# Patient Record
Sex: Female | Born: 1950 | Race: White | Hispanic: No | Marital: Married | Smoking: Never smoker
Health system: Southern US, Community
[De-identification: ages and names within clinical notes are randomized; demographics above are authoritative.]

## PROBLEM LIST (undated history)

## (undated) DIAGNOSIS — G4700 Insomnia, unspecified: Secondary | ICD-10-CM

## (undated) DIAGNOSIS — F329 Major depressive disorder, single episode, unspecified: Secondary | ICD-10-CM

## (undated) DIAGNOSIS — E785 Hyperlipidemia, unspecified: Secondary | ICD-10-CM

## (undated) DIAGNOSIS — I10 Essential (primary) hypertension: Secondary | ICD-10-CM

## (undated) DIAGNOSIS — F32A Depression, unspecified: Secondary | ICD-10-CM

## (undated) DIAGNOSIS — F419 Anxiety disorder, unspecified: Secondary | ICD-10-CM

## (undated) DIAGNOSIS — K219 Gastro-esophageal reflux disease without esophagitis: Secondary | ICD-10-CM

## (undated) HISTORY — DX: Gastro-esophageal reflux disease without esophagitis: K21.9

## (undated) HISTORY — DX: Insomnia, unspecified: G47.00

## (undated) HISTORY — DX: Depression, unspecified: F32.A

## (undated) HISTORY — PX: REPLACEMENT TOTAL KNEE BILATERAL: SUR1225

## (undated) HISTORY — PX: TONSILLECTOMY: SUR1361

## (undated) HISTORY — PX: OTHER SURGICAL HISTORY: SHX169

## (undated) HISTORY — DX: Anxiety disorder, unspecified: F41.9

## (undated) HISTORY — PX: ROTATOR CUFF REPAIR: SHX139

## (undated) HISTORY — DX: Hyperlipidemia, unspecified: E78.5

## (undated) HISTORY — DX: Essential (primary) hypertension: I10

---
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
## 2010-01-17 ENCOUNTER — Inpatient Hospital Stay (HOSPITAL_COMMUNITY)
Admission: RE | Admit: 2010-01-17 | Discharge: 2010-01-21 | Payer: Self-pay | Source: Home / Self Care | Attending: Orthopedic Surgery | Admitting: Orthopedic Surgery

## 2010-01-19 ENCOUNTER — Encounter (INDEPENDENT_AMBULATORY_CARE_PROVIDER_SITE_OTHER): Payer: Self-pay | Admitting: Orthopedic Surgery

## 2010-04-05 NOTE — Discharge Summary (Signed)
NAMESHARLET, Payne          ACCOUNT NO.:  [id number]  MEDICAL RECORD NO.:  [id number]          PATIENT TYPE:  INP  LOCATION:  5525                         FACILITY:  [facility]  PHYSICIAN:  Mila Homer. Paula Payne, M.D. DATE OF BIRTH:  04/07/1950  DATE OF ADMISSION:  01/17/2010 DATE OF DISCHARGE:  01/21/2010                              DISCHARGE SUMMARY   ADMISSION DIAGNOSIS:  Osteoarthritis of the right knee.  DISCHARGE DIAGNOSES: 1. Osteoarthritis of the right knee. 2. Status post right total knee arthroplasty. 3. Acute blood loss anemia, status post surgery. 4. Pulmonary embolism.  PROCEDURE:  Right total knee arthroplasty.  HISTORY OF PRESENT ILLNESS:  The patient is a 60 year old female who complained of pain in her right knee times several years, now interfering with activities of daily living.  Conservative treatments failed.  Risk and benefits of surgery were discussed with the patient. The patient would like to proceed with right total knee arthroplasty.  ALLERGIES:  Upon admission to the hospital, the patient was allergic SULFA, AMOXICILLIN, LORATADINE, and CLARITIN.  ADMISSION MEDICATIONS:  Upon admission to hospital, the patient was taking: 1. Vitamin D 1000 units daily. 2. Calcium 600 mg daily. 3. Aspirin 800 mg daily, stop 10 days post surgery. 4. Celebrex 200 mg daily. 5. Singulair 1 dose daily as needed. 6. Simvastatin 40 mg daily. 7. Fish oil 1000 mg daily. 8. Clonazepam 0.5 mg 1 tablet daily at bedtime. 9. Omeprazole 20 mg daily. 10.Nitro 50 mg daily. 11.Kenalog spray 50 as needed. 12.Aleve 2 daily.  HOSPITAL COURSE:  This is a 60 year old female who was admitted to hospital on January 17, 2010. After appropriate laboratory studies were obtained preoperatively as well as Ancef on-call to the operating room, she was taken to the OR where she underwent a right total knee arthroplasty.  She tolerated the procedure well and was taken to the PACU in good  condition.  The patient was placed on Dilaudid IV and other p.o. pain medication.  Foley was placed intraoperatively.  On postop day #1, vital signs stable.  The patient denied chest pain, shortness of breath, or calf pain.  The patient was started on Lovenox 30 mg subcu q.12 h. a day at 8 a.m.  The patient was started on Xarelto 10 mg p.o. daily started at 8 a.m.  Consults with PT, OT, and Care Management were made.  The patient is weightbearing as tolerated.  CPM 0- 9 degrees 6-8 hours per day.  Incentive spirometry was taught.  On postop day #2, the patient continued to progress with physical therapy, but slowly.  The patient's dressing was changed.  Marcaine pump and Hemovac were discontinued.  Foley catheter was discontinued.  The patient appeared to be short of breath and to be dizzy.  Angiogram was ordered which found a PE.  The patient was quickly called for consult. On postop day #4, the patient was stable.  Medicine was following for PE.  She was still doing physical therapy slowly for her knee and she was coming around.  On Friday, she was discharged after PT.  She was cleared by Medicine and she was set up with home health PT.  LABORATORY STUDIES:  Upon discharge from the hospital, H&H was 8.9 with 25.9, white blood cell count was 8.4, and platelets were 192.  Sodium was 138, potassium 3.3, chloride was 105, CO2 was 26, BUN 4, creatinine was 0.66, and glucose was 115.  DISCHARGE INSTRUCTIONS:  There are no restrictions to diet.  She is follow the blue instruction sheet for wound care.  Increase activity slowly.  May use a cane or walker.  Weightbearing as tolerated.  No lifting or driving for 6 weeks.  Home health has been established.  The patient is to follow up with [place] in 2 weeks.  She was placed in the hospital on Lovenox bridge and Coumadin which she would continue.  I did get in touch with her primary care doctor in [place] who will continue to manage her Coumadin.   The patient is to follow with him and then follow up with [place] in 2 weeks.  DISCHARGE MEDICATIONS:  Upon discharge from the hospital, the patient was given prescriptions for Lovenox 40 mg daily to bridge and then Coumadin 5 mg to be monitored per home health.  The patient was also given Robaxin 500 mg 1-2 tablet every 6-8 hours as needed for pain, OxyContin mg 1-2 tablets every 4-6 hours as needed for pain, and started on Robaxin as needed for spasm.  The patient will follow up with Dr. Sherlean Payne in 2 weeks.  She is to call for that appointment at [phone].  Discharged in improved condition.    ______________________________ Altamese Cabal, PA-C   ______________________________ Mila Homer. Paula Payne, M.D.    MJ/MEDQ  D:  02/18/2010  T:  02/19/2010  Job:  [id number]  Electronically Signed by Altamese Cabal PA-C on 03/03/2010 09:21:01 AM Electronically Signed by Georgena Spurling M.D. on 04/05/2010 08:59:53 PM

## 2010-04-26 LAB — URINALYSIS, ROUTINE W REFLEX MICROSCOPIC
Leukocytes, UA: NEGATIVE
Nitrite: NEGATIVE
Nitrite: NEGATIVE
Protein, ur: NEGATIVE mg/dL
Specific Gravity, Urine: 1.003 — ABNORMAL LOW (ref 1.005–1.030)
Urobilinogen, UA: 1 mg/dL (ref 0.0–1.0)
Urobilinogen, UA: 0.2 mg/dL (ref 0.0–1.0)

## 2010-04-26 LAB — PROTIME-INR
INR: 1.32 (ref 0.00–1.49)
Prothrombin Time: 16.6 seconds — ABNORMAL HIGH (ref 11.6–15.2)
Prothrombin Time: 23 seconds — ABNORMAL HIGH (ref 11.6–15.2)

## 2010-04-26 LAB — CARDIAC PANEL(CRET KIN+CKTOT+MB+TROPI)
CK, MB: 0.7 ng/mL (ref 0.3–4.0)
Relative Index: INVALID (ref 0.0–2.5)
Troponin I: 0.04 ng/mL (ref 0.00–0.06)
Troponin I: 0.04 ng/mL (ref 0.00–0.06)

## 2010-04-26 LAB — COMPREHENSIVE METABOLIC PANEL
AST: 20 U/L (ref 0–37)
Albumin: 4.5 g/dL (ref 3.5–5.2)
BUN: 5 mg/dL — ABNORMAL LOW (ref 6–23)
BUN: 10 mg/dL (ref 6–23)
Calcium: 8 mg/dL — ABNORMAL LOW (ref 8.4–10.5)
Creatinine, Ser: 0.7 mg/dL (ref 0.4–1.2)
Creatinine, Ser: 0.74 mg/dL (ref 0.4–1.2)
GFR calc Af Amer: >60 mL/min (ref >60)
Glucose, Bld: 111 mg/dL — ABNORMAL HIGH (ref 70–99)
Potassium: 3.7 mEq/L (ref 3.5–5.1)
Total Protein: 5 g/dL — ABNORMAL LOW (ref 6.0–8.3)
Total Protein: 6.4 g/dL (ref 6.0–8.3)

## 2010-04-26 LAB — CBC
HCT: 24.2 % — ABNORMAL LOW (ref 36.0–46.0)
HCT: 26.2 % — ABNORMAL LOW (ref 36.0–46.0)
HCT: 27.1 % — ABNORMAL LOW (ref 36.0–46.0)
HCT: 27.2 % — ABNORMAL LOW (ref 36.0–46.0)
Hemoglobin: 8.7 g/dL — ABNORMAL LOW (ref 12.0–15.0)
Hemoglobin: 8.9 g/dL — ABNORMAL LOW (ref 12.0–15.0)
Hemoglobin: 12.5 g/dL (ref 12.0–15.0)
MCH: 31.2 pg (ref 26.0–34.0)
MCHC: 33.2 g/dL (ref 30.0–36.0)
MCHC: 33.5 g/dL (ref 30.0–36.0)
MCHC: 33.6 g/dL (ref 30.0–36.0)
MCV: 88 fL (ref 78.0–100.0)
MCV: 88.6 fL (ref 78.0–100.0)
MCV: 92 fL (ref 78.0–100.0)
MCV: 92.3 fL (ref 78.0–100.0)
Platelets: 185 10*3/uL (ref 150–400)
Platelets: 192 10*3/uL (ref 150–400)
Platelets: 251 10*3/uL (ref 150–400)
RBC: 2.63 MIL/uL — ABNORMAL LOW (ref 3.87–5.11)
RBC: 2.84 MIL/uL — ABNORMAL LOW (ref 3.87–5.11)
RBC: 2.93 MIL/uL — ABNORMAL LOW (ref 3.87–5.11)
RBC: 4.08 MIL/uL (ref 3.87–5.11)
RDW: 13.2 % (ref 11.5–15.5)
RDW: 13.6 % (ref 11.5–15.5)
RDW: 13.8 % (ref 11.5–15.5)
WBC: 10.1 10*3/uL (ref 4.0–10.5)
WBC: 10.3 10*3/uL (ref 4.0–10.5)
WBC: 11.3 10*3/uL — ABNORMAL HIGH (ref 4.0–10.5)

## 2010-04-26 LAB — APTT: aPTT: 28 seconds (ref 24–37)

## 2010-04-26 LAB — CROSSMATCH
Antibody Screen: NEGATIVE
Unit division: 0

## 2010-04-26 LAB — BASIC METABOLIC PANEL
BUN: 4 mg/dL — ABNORMAL LOW (ref 6–23)
CO2: 29 mEq/L (ref 19–32)
CO2: 30 mEq/L (ref 19–32)
Calcium: 8.1 mg/dL — ABNORMAL LOW (ref 8.4–10.5)
Chloride: 102 mEq/L (ref 96–112)
Chloride: 105 mEq/L (ref 96–112)
Creatinine, Ser: 0.66 mg/dL (ref 0.4–1.2)
Creatinine, Ser: 0.7 mg/dL (ref 0.4–1.2)
GFR calc Af Amer: >60 mL/min (ref >60)
GFR calc Af Amer: >60 mL/min (ref >60)
GFR calc non Af Amer: >60 mL/min (ref >60)
Potassium: 3.5 mEq/L (ref 3.5–5.1)
Potassium: 3.7 mEq/L (ref 3.5–5.1)
Sodium: 137 mEq/L (ref 135–145)
Sodium: 138 mEq/L (ref 135–145)

## 2010-04-26 LAB — URINE CULTURE

## 2010-04-26 LAB — ABO/RH: ABO/RH(D): O POS

## 2010-04-26 LAB — DIFFERENTIAL
Basophils Absolute: 0 10*3/uL (ref 0.0–0.1)
Eosinophils Relative: 2 % (ref 0–5)
Eosinophils Relative: 2 % (ref 0–5)
Lymphocytes Relative: 8 % — ABNORMAL LOW (ref 12–46)
Lymphocytes Relative: 29 % (ref 12–46)
Monocytes Absolute: 0.5 10*3/uL (ref 0.1–1.0)
Monocytes Absolute: 0.4 10*3/uL (ref 0.1–1.0)
Monocytes Relative: 4 % (ref 3–12)
Neutro Abs: 5.5 10*3/uL (ref 1.7–7.7)

## 2010-04-26 LAB — URINE MICROSCOPIC-ADD ON

## 2010-04-26 LAB — CULTURE, BLOOD (ROUTINE X 2): Culture: NO GROWTH

## 2010-04-26 LAB — SURGICAL PCR SCREEN
MRSA, PCR: NEGATIVE
Staphylococcus aureus: NEGATIVE

## 2010-04-26 LAB — MAGNESIUM: Magnesium: 1.9 mg/dL (ref 1.5–2.5)

## 2012-08-08 IMAGING — CT CT ANGIO CHEST
2 of 6 series · 19 of 36 positions shown · IV contrast (APPLIED)
Comparison: None.

CLINICAL DATA: Short of breath, recent knee arthroplasty

CT ANGIOGRAPHY CHEST WITH CONTRAST
TECHNIQUE: Multidetector CT imaging of the chest was performed
using the standard protocol during bolus administration of
intravenous contrast.  Multiplanar CT image reconstructions
including MIPs were obtained to evaluate the vascular anatomy.
Contrast:  100 ml Omnipaque 300

[Series 9: pulm embolism 1.0 b25f thins · axial · 0.57mm/px · z∈[-236,-8]mm · 18 of 254 slices shown]
[im 13/254  lung]
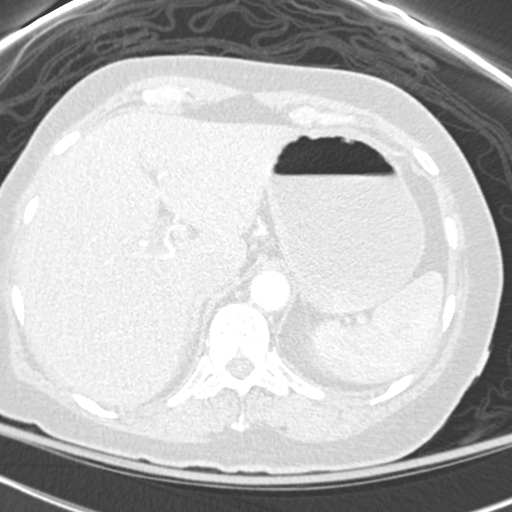
[im 26/254  mediastinal]
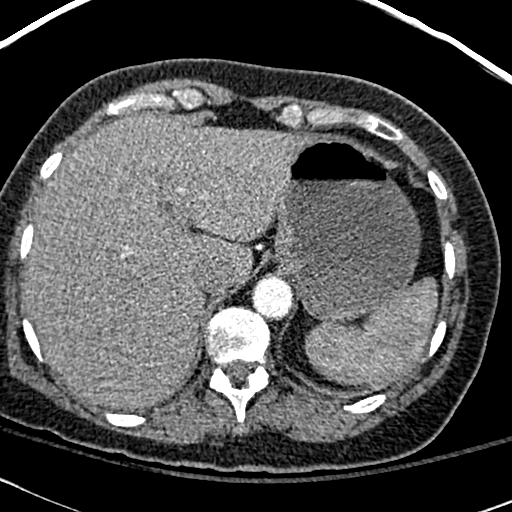
[im 38/254  lung]
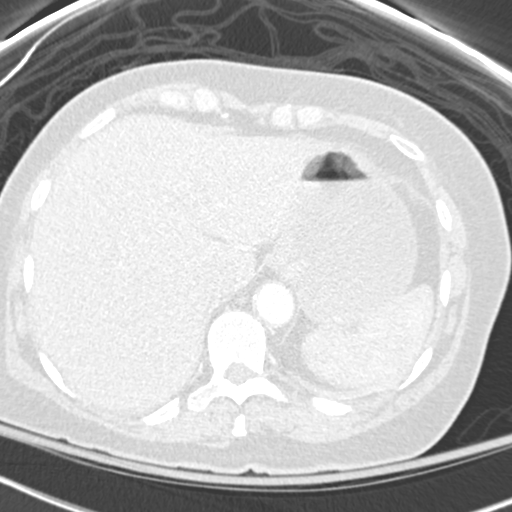
[im 51/254  mediastinal]
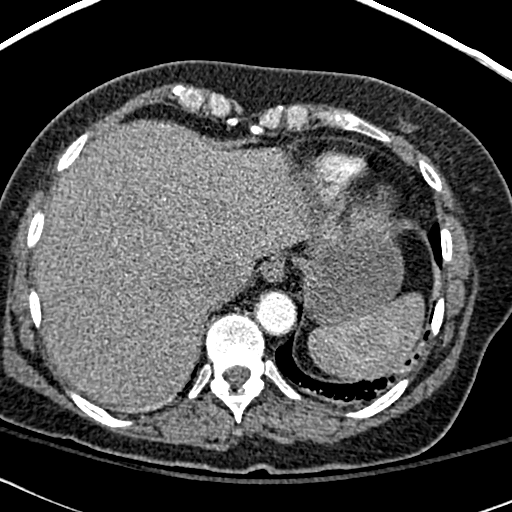
[im 64/254  lung]
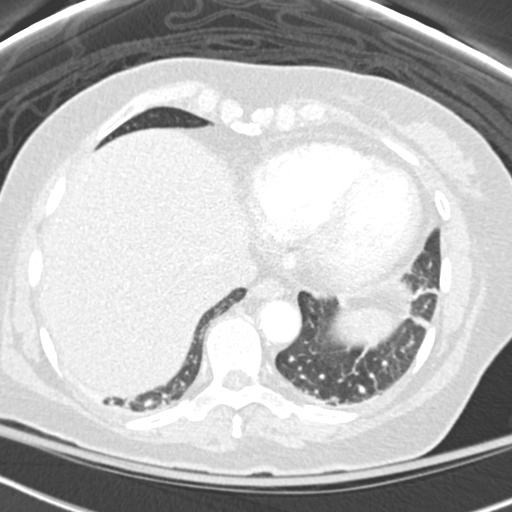
[im 76/254  mediastinal]
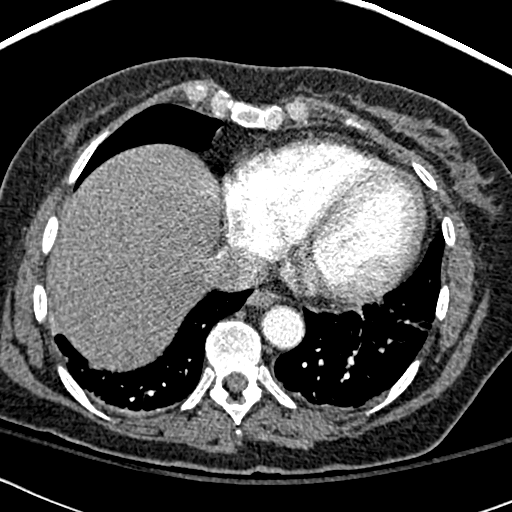
[im 89/254  lung]
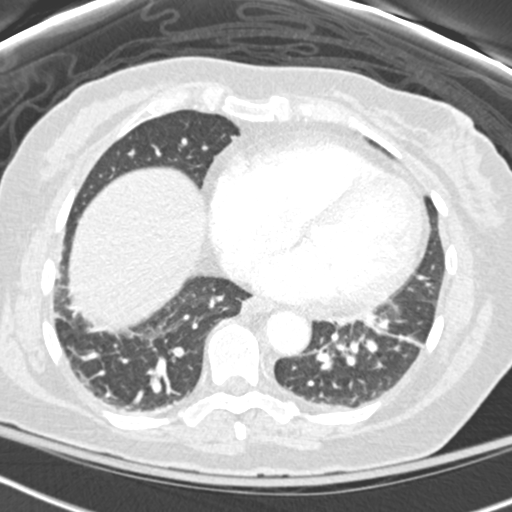
[im 102/254  mediastinal]
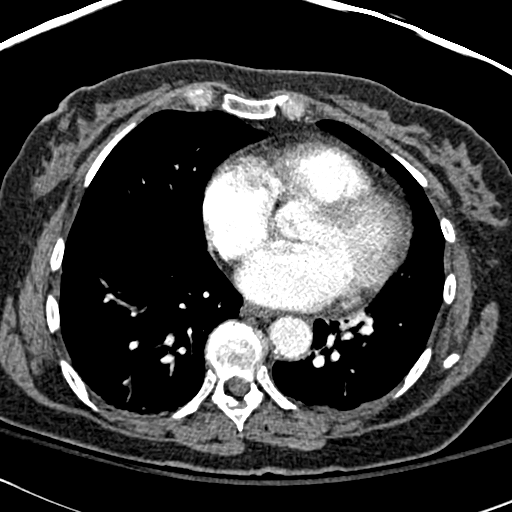
[im 114/254  lung]
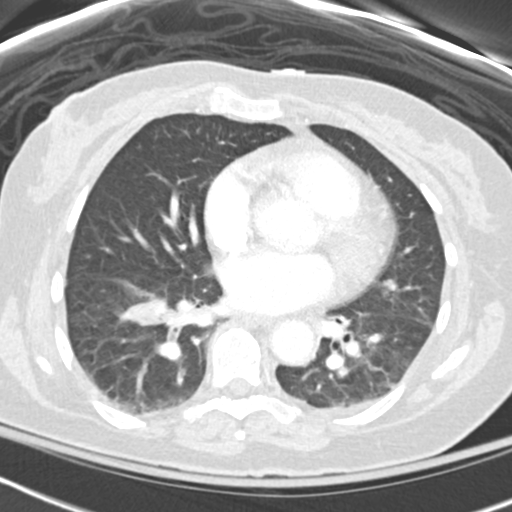
[im 140/254  mediastinal]
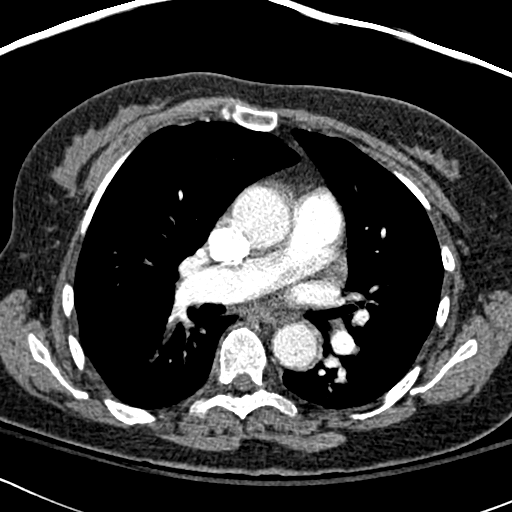
[im 152/254  lung]
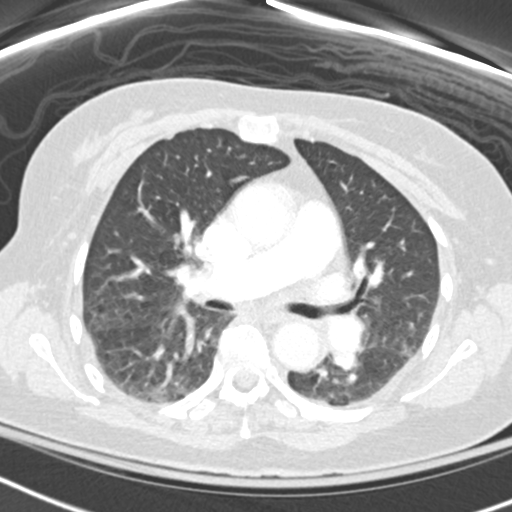
[im 165/254  mediastinal]
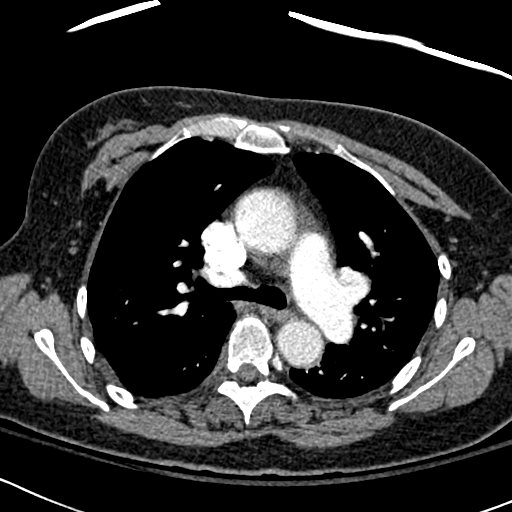
[im 178/254  lung]
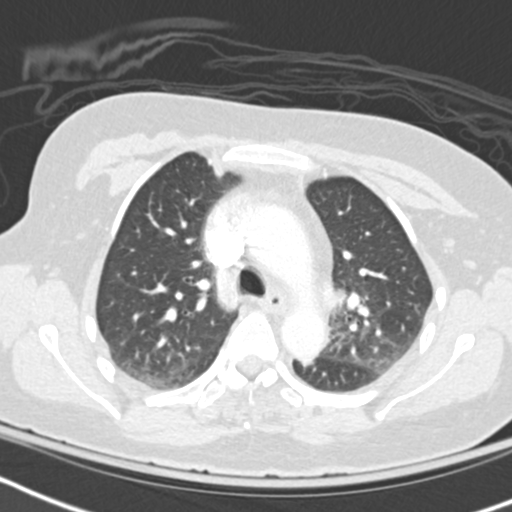
[im 190/254  mediastinal]
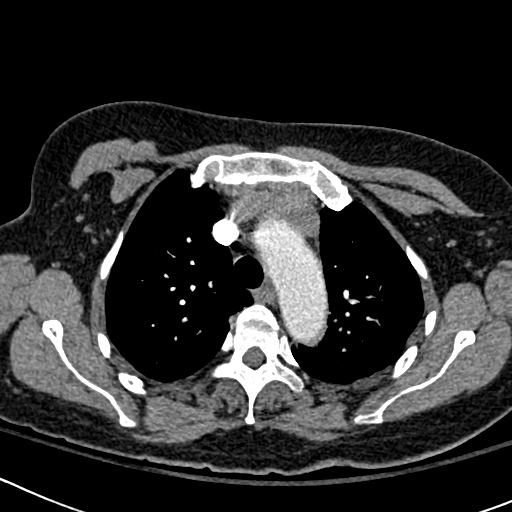
[im 203/254  lung]
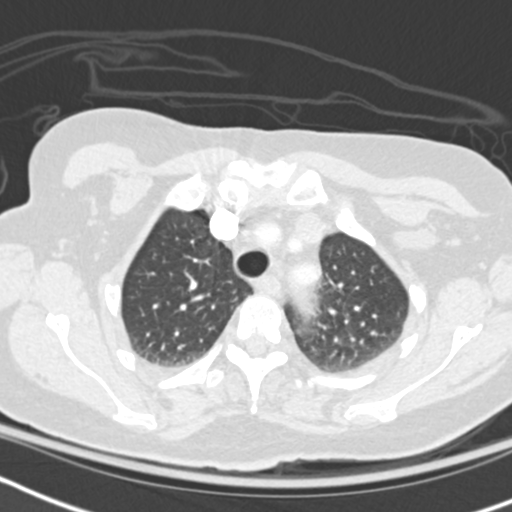
[im 216/254  mediastinal]
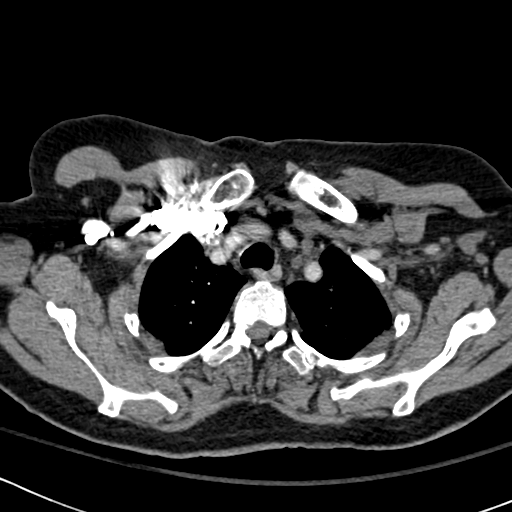
[im 228/254  lung]
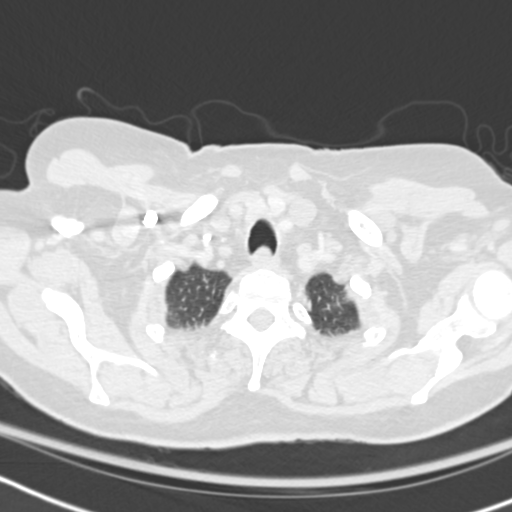
[im 241/254  mediastinal]
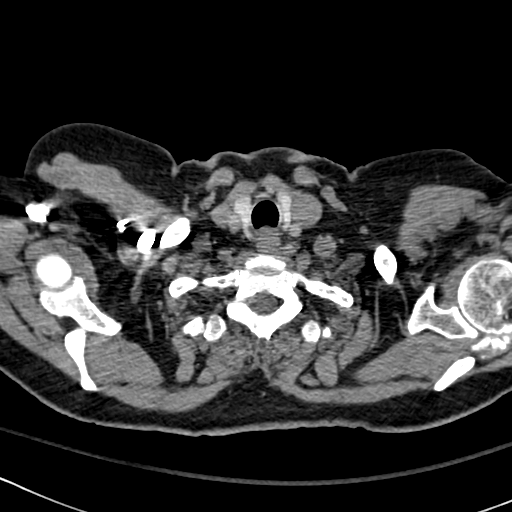

[Series 602: coronal mpr · coronal · 0.57mm/px · 1 of 72 slices shown]
[im 36/72  mediastinal]
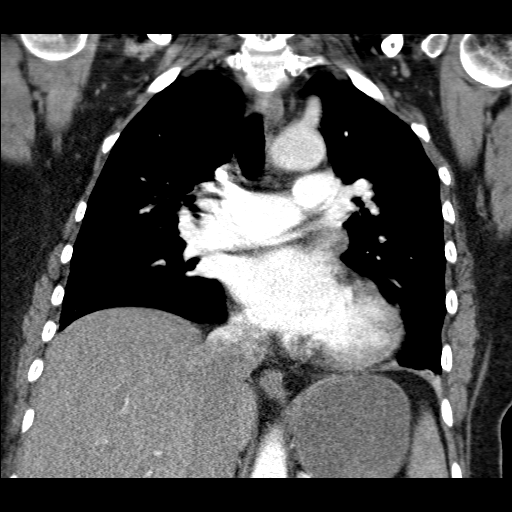

[19 of 36 positions shown; findings below may reference images not displayed]

FINDINGS: There are several small  filling defects within the
lower lobe pulmonary arteries consistent with acute pulmonary
embolism.  Filling defects are within the segmental branches of the
right lower lobe including the lateral segment of the right lower
lobe and the posterior segment right lower lobe (images 140 and 163
of series 9).  There is small filling defect within the segmental
branch of the lingula pulmonary artery (image 96).  Small filling
defect within the pulmonary artery to the superior segment of the
left lower lobe (image 122).  The heart appears normal.  No right
ventricular strain evident.  No pericardial fluid.  Aorta is
normal.

Review lung parenchyma demonstrates no evidence of infarction.
There is a minimal basilar atelectasis.

No evidence of axillary or mediastinal lymphadenopathy.  Limited
view of the upper abdomen is unremarkable.  Limited view of the
skeleton is unremarkable.

Review of the MIP images confirms the above findings.
IMPRESSION: Acute pulmonary embolism within the sub segmental branches of the
left lower lobe, right lower lobe, and lingula.  Overall clot
burden is mild to moderate.

Critical test results telephoned to [place] at the time of

## 2012-08-09 IMAGING — CR DG CHEST 2V
1 series · 1 of 1 positions shown · non-contrast
Comparison: 01/12/2010

CLINICAL DATA: Postoperative exam after knee surgery.

CHEST - 2 VIEW

[view not recorded]
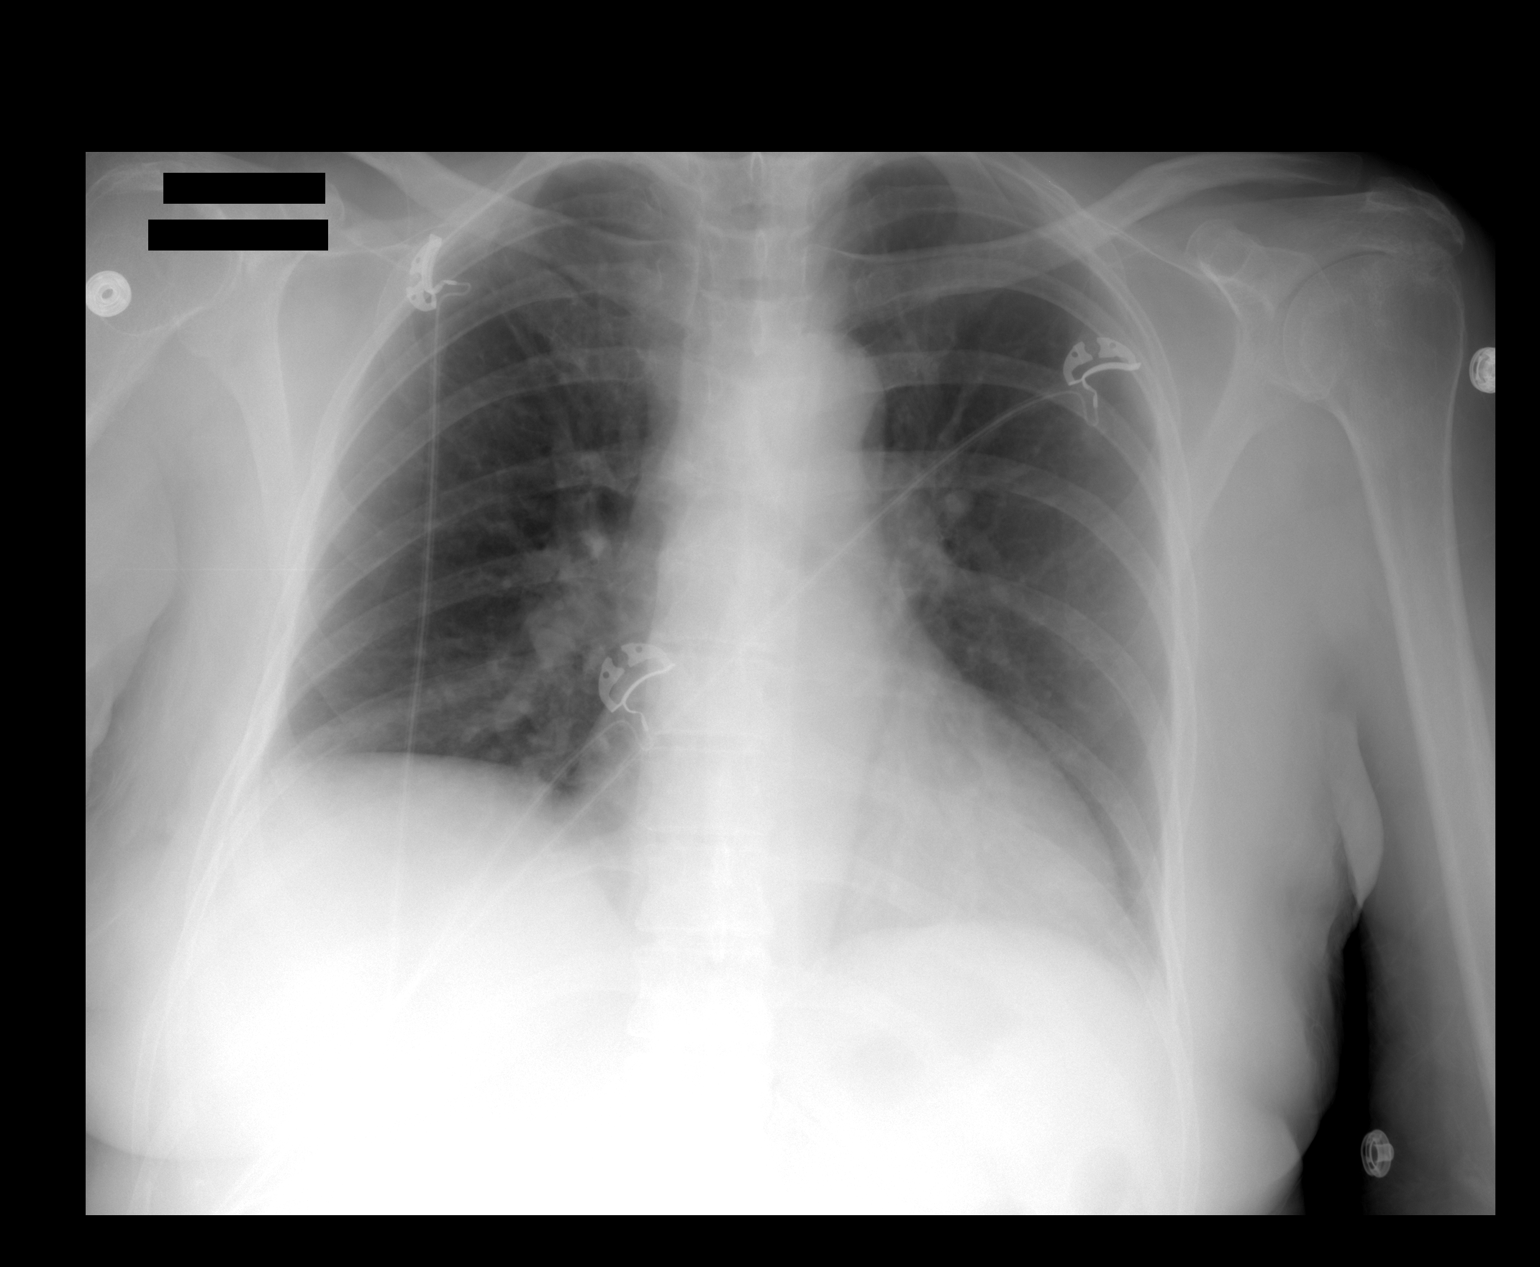

[1 of 1 positions shown; findings below may reference images not displayed]

FINDINGS: The heart size and pulmonary vascularity are normal and
the lungs are clear.  No osseous abnormality.
IMPRESSION: Normal chest.

## 2016-02-14 DIAGNOSIS — C50919 Malignant neoplasm of unspecified site of unspecified female breast: Secondary | ICD-10-CM

## 2016-02-14 HISTORY — DX: Malignant neoplasm of unspecified site of unspecified female breast: C50.919

## 2016-02-14 HISTORY — PX: BREAST LUMPECTOMY: SHX2

## 2019-04-25 ENCOUNTER — Encounter: Payer: Self-pay | Admitting: Internal Medicine

## 2019-05-24 NOTE — Progress Notes (Signed)
Referring Provider: Tobe Sos, MD Primary Care Physician:  Tobe Sos, MD Primary Gastroenterologist:  Dr. Gala Romney  Chief Complaint  Patient presents with  . Consult    TCS last done 25 years ago    HPI:   Paula Payne is a 69 y.o. female presenting today at the request of Pradhan, Tilden Fossa, MD for consult colonoscopy.  Office visit due to medications.  Reviewed most recent PCP note dated 04/11/2019.  She was overall doing well and her primary concern was stress related to her relationship with her husband.  Husband recently had Covid and was taking care of him.  She was worried about getting isolated from other friends.  Otherwise, her chronic medical conditions were well controlled.  Labs completed with PCP 04/11/2019: WBC 6.2, hemoglobin 11.8 (L), MCV 94, MCH 30, MCHC 32, platelets 256, Sodium 141, potassium 4.4, chloride 102, glucose 94, creatinine 0.8, albumin 4.3, total bilirubin 0.7, alk phos 69, AST 29, ALT 19 Total cholesterol 163, HLD 62, triglycerides 94, LDL 85 UA 04/10/2018 with small amount of blood.   Labs in July 2020 with hemoglobin 11.3 (L)  Today:  Thinks her last colonoscopy was 25 years ago. Per PCP note she had one in 2010. She states she only had one in her life. No colon polyps. No blood in her stool. No black stools. No unintentional weight loss. BMs daily. No constipation or diarrhea. No abdominal pain. No family history of colon cancer.   GERD symptom are well controlled on omeprazole daily. No dysphagia, nausea, or vomiting. Takes 800 mg ibuprofen for arthritis, about twice a week for a while now.   Used to take iron a long time ago. Doesn't remember what this was for.   Soreness in the RLQ. Only feels it when she presses on it. Otherwise, she is fine. No identified triggers. No association with meals or BM. Tends to notice when she is sitting on the couch after dinner. Present for a few weeks. Not worsening.    Past Medical History:   Diagnosis Date  . Anxiety and depression   . Breast cancer ([place]) 2018  . GERD (gastroesophageal reflux disease)   . Hyperlipidemia   . Hypertension   . Insomnia     Past Surgical History:  Procedure Laterality Date  . BREAST LUMPECTOMY Right 2018  . Complete hysterectomy    . REPLACEMENT TOTAL KNEE BILATERAL    . ROTATOR CUFF REPAIR Left     Current Outpatient Medications  Medication Sig Dispense Refill  . aluminum hydroxide-magnesium carbonate (GAVISCON) 95-358 MG/15ML SUSP Take by mouth.    . Ascorbic Acid (VITAMIN C) 1000 MG tablet Take 1,000 mg by mouth daily.    . calcium carbonate (CALCIUM 600) 600 MG TABS tablet daily.    . citalopram (CELEXA) 20 MG tablet Take 20 mg by mouth daily.    . clonazePAM (KLONOPIN) 0.5 MG tablet Take 0.5 mg by mouth daily.    . Cyanocobalamin (B-12 PO) Take by mouth daily.    . fluticasone (FLONASE) 50 MCG/ACT nasal spray Place into both nostrils daily.    . Glucosamine-Chondroitin (GLUCOSAMINE CHONDR COMPLEX PO) daily.    Marland Kitchen letrozole (FEMARA) 2.5 MG tablet Take 2.5 mg by mouth daily.    Marland Kitchen loratadine (CLARITIN) 10 MG tablet Take 10 mg by mouth daily as needed for allergies.    . montelukast (SINGULAIR) 10 MG tablet Take 10 mg by mouth at bedtime.    . Omega-3 Fatty Acids (FISH OIL)  1000 MG CAPS daily.    Marland Kitchen omeprazole (PRILOSEC) 40 MG capsule Take 40 mg by mouth daily.    . QUEtiapine (SEROQUEL) 25 MG tablet Take 25 mg by mouth at bedtime.    . simvastatin (ZOCOR) 40 MG tablet Take 40 mg by mouth daily.    . TURMERIC PO daily.     No current facility-administered medications for this visit.    Allergies as of 05/26/2019 - Review Complete 05/26/2019  Allergen Reaction Noted  . Amoxicillin Other (See Comments) 05/26/2019  . Codeine  05/26/2019  . Celecoxib Rash 05/26/2019  . Other Rash 05/26/2019    Family History  Problem Relation Age of Onset  . Colon cancer Neg Hx     Social History   Socioeconomic History  . Marital status:  Married    Spouse name: Not on file  . Number of children: Not on file  . Years of education: Not on file  . Highest education level: Not on file  Occupational History  . Not on file  Tobacco Use  . Smoking status: Never Smoker  . Smokeless tobacco: Never Used  Substance and Sexual Activity  . Alcohol use: Never  . Drug use: Never  . Sexual activity: Not on file  Other Topics Concern  . Not on file  Social History Narrative  . Not on file   Social Determinants of Health   Financial Resource Strain:   . Difficulty of Paying Living Expenses:   Food Insecurity:   . Worried About Charity fundraiser in the Last Year:   . Arboriculturist in the Last Year:   Transportation Needs:   . Film/video editor (Medical):   Marland Kitchen Lack of Transportation (Non-Medical):   Physical Activity:   . Days of Exercise per Week:   . Minutes of Exercise per Session:   Stress:   . Feeling of Stress :   Social Connections:   . Frequency of Communication with Friends and Family:   . Frequency of Social Gatherings with Friends and Family:   . Attends Religious Services:   . Active Member of Clubs or Organizations:   . Attends Archivist Meetings:   Marland Kitchen Marital Status:   Intimate Partner Violence:   . Fear of Current or Ex-Partner:   . Emotionally Abused:   Marland Kitchen Physically Abused:   . Sexually Abused:     Review of Systems: Gen: Denies any fever, chills, cold or flulike symptoms, lightheadedness, dizziness, presyncope, syncope. CV: Denies chest pain heart palpitations.  Resp: Denies shortness of breath or cough. GI: See HPI GU : Has appointment to see urology on 06/16/19. Has frequent bladder infections and had one currently. No gross hematuria.  MS: Admits to pain in her knees. Bilateral knee replacement. Left knee causes more trouble.  Derm: Denies rash Psych: Denies depression or anxiety.  Heme: See HPI  Physical Exam: BP 140/72   Pulse 60   Temp (!) 97 F (36.1 C) (Oral)   Ht  5' 7"  (1.702 m)   Wt 167 lb 9.6 oz (76 kg)   BMI 26.25 kg/m  General:   Alert and oriented. Pleasant and cooperative. Well-nourished and well-developed.  Head:  Normocephalic and atraumatic. Eyes:  Without icterus, sclera clear and conjunctiva pink.  Ears:  Normal auditory acuity. Lungs:  Clear to auscultation bilaterally. No wheezes, rales, or rhonchi. No distress.  Heart:  S1, S2 present without murmurs appreciated.  Abdomen:  +BS, soft, and non-distended.  Very  mild tenderness to palpation in the very low right lower quadrant/pelvic area.  No masses appreciated.  No rebound or guarding.  No HSM noted.   Rectal:  Deferred  Msk:  Symmetrical without gross deformities. Normal posture. Extremities:  Without edema. Neurologic:  Alert and  oriented x4;  grossly normal neurologically. Skin:  Intact without significant lesions or rashes. Psych: Normal mood and affect.  Labs: 04/11/2019: WBC 6.2, hemoglobin 11.8 (L), CV 94, MCH 30, MCHC 32, platelets 256 sodium 141, potassium 4.4, chloride 102, calcium 9.8, glucose 94, creatinine 0.8, albumin 4.3, total bilirubin 0.7, alk phos 69, AST 29, ALT 19 Total cholesterol 163, HDL 62, triglycerides 94, LDL 85 UA: UA blood small, UA bacteria occasional  08/14/2018: WBC 6.7, hemoglobin 11.3 (L), MCV 93, MCH 31, MCHC 33, platelets 275 Sodium 141, potassium 4.3, calcium 9.3, glucose 117, creatinine 0.8, total bilirubin 0.4, albumin 3.8, alk phos 87, AST 25, ALT 14 UA: UA blood trace-lysed, bacteria rare

## 2019-05-26 ENCOUNTER — Encounter: Payer: Self-pay | Admitting: Gastroenterology

## 2019-05-26 ENCOUNTER — Other Ambulatory Visit: Payer: Self-pay

## 2019-05-26 ENCOUNTER — Ambulatory Visit (INDEPENDENT_AMBULATORY_CARE_PROVIDER_SITE_OTHER): Payer: Medicare Other | Admitting: Gastroenterology

## 2019-05-26 VITALS — BP 140/72 | HR 60 | Temp 97.0°F | Ht 67.0 in | Wt 167.6 lb

## 2019-05-26 DIAGNOSIS — D649 Anemia, unspecified: Secondary | ICD-10-CM | POA: Diagnosis not present

## 2019-05-26 DIAGNOSIS — R1031 Right lower quadrant pain: Secondary | ICD-10-CM | POA: Diagnosis not present

## 2019-05-26 DIAGNOSIS — Z1211 Encounter for screening for malignant neoplasm of colon: Secondary | ICD-10-CM | POA: Insufficient documentation

## 2019-05-26 NOTE — Assessment & Plan Note (Signed)
Mild anemia with hemoglobin 11.8 noted on labs completed with PCP on 04/11/2019.  Normocytic indices.  Hemoglobin is stable/slightly improved compared to labs in July 2020 with hemoglobin 11.3.  She denies bright red blood per rectum or melena.  Chronic history of GERD but this is well controlled on omeprazole 40 mg daily.  800 mg ibuprofen a couple times a week.  No vaginal bleeding with history of complete hysterectomy.  Reports history of frequent UTIs with recent UA with PCP on 04/11/2019 with a small amount of blood in her urine.  Patient denies gross hematuria.  He is scheduled to see urology on 06/16/19.  Denies history of anemia.  Reports being on iron many years ago but does not remember why.  Very mild anemia may be due to occult blood loss in her urine.  We will go ahead and update an iron panel at this time and she does not have one on file.  She is due for colon cancer screening with last colonoscopy at least 10 years ago as discussed below.  This will help evaluate for any colonic etiology of anemia.

## 2019-05-26 NOTE — Assessment & Plan Note (Addendum)
Patient reports a couple week history of right lower quadrant abdominal/pelvic tenderness to palpation.  Denies pain without palpation.  No identified triggers.  No association with meals or bowel habits.  Denies constipation, bright red blood per rectum, melena, or unintentional weight loss.  She does report frequent UTIs with a UTI at this time and is currently scheduled to see urology on 06/16/2019.  Recent UA completed with PCP in February 2021 with small amount of blood in her urine.  Abdominal exam with very mild tenderness to palpation in the very low RLQ/pelvic area.  No obvious masses.  No rebound.  Unclear etiology of mild pain.  May be secondary to UTI/bladder issues.  Doubt appendicitis as symptoms have not worsened over the last couple of weeks.  Do not suspect any significant colonic source; however, she is due for colonoscopy for colon cancer screening which we are scheduling as discussed above.  She was advised to continue to monitor her symptoms and let us know of any worsening and we would likely pursue imaging at that time.

## 2019-05-26 NOTE — Progress Notes (Signed)
CC'ED TO PCP 

## 2019-05-26 NOTE — Patient Instructions (Addendum)
We already scheduled for colonoscopy in the near future with Dr. Gala Romney.   Please have your labs completed.  This is to check your iron to ensure you are not developing any iron deficiency as her hemoglobin is slightly low.  Continue to monitor your abdominal pain.  Should you have any worsening abdominal pain, please let us know immediately.  We will see you back after your colonoscopy.  Call with questions or concerns prior.  Aliene Altes, PA-C [facility]

## 2019-05-26 NOTE — Assessment & Plan Note (Addendum)
69 year old with history of right breast cancer in 2018, HTN, HLD, insomnia, anxiety and depression, and GERD presenting to schedule colonoscopy for colon cancer screening.  Per patient, last colonoscopy was at least 10 years ago.  No history of colon polyps.  No significant upper or lower GI symptoms at this time.  BMs daily.  Denies bright red blood per rectum, melena, or unintentional weight loss.  She does have mild nonspecific very low right-sided abdominal/pelvic tenderness to palpation.  No pain without palpation.  Tenderness has been present for a couple of weeks.  Of note, she does report having a UTI at this time and is scheduled to see urology on 06/16/2019 for frequent UTIs.  Additionally, recent hemoglobin on file 11.8 (L).  This is stable compared to hemoglobin of 11.3 in July 2020.  Normocytic indices.  Recent UA from February 2021 with small amount of blood in her urine.  She needs colonoscopy for colon cancer screening.  Doubt her abdominal pain is related to any significant colonic etiology but she was advised to continue to monitor and let us know of any worsening symptoms and we we would pursue imaging.  Additionally, suspect mild anemia may be secondary to occult blood loss in her urine but we will plan to update iron panel as we have not have one on file.  Additionally, colonoscopy will help evaluate for any colonic etiology of anemia such as colon polyps or malignancy.  Proceed with TCS with propofol with Dr. Gala Romney in the near future. The risks, benefits, and alternatives have been discussed in detail with patient. They have stated understanding and desire to proceed.  Propofol due to medications including Celexa, Klonopin, and Seroquel. Continue to monitor abdominal pain and call with any worsening symptoms. Follow-up after procedure.

## 2019-06-18 LAB — IRON,TIBC AND FERRITIN PANEL
Ferritin: 205 ng/mL — ABNORMAL HIGH (ref 15–150)
Iron Saturation: 28 % (ref 15–55)
Iron: 76 ug/dL (ref 27–139)
Total Iron Binding Capacity: 274 ug/dL (ref 250–450)
UIBC: 198 ug/dL (ref 118–369)

## 2019-07-31 NOTE — Patient Instructions (Signed)
Your procedure is scheduled on: 08/07/2019  Report to [facility] at   11:15  AM.  Call this number if you have problems the morning of surgery: [phone]   Remember:              Follow Directions on the letter you received from Your Physician's office regarding the Bowel Prep              No Smoking the day of Procedure :   Take these medicines the morning of surgery with A SIP OF WATER: Celexa, Klonopin, Flonase and omprazole   Do not wear jewelry, make-up or nail polish.    Do not bring valuables to the hospital.  Contacts, dentures or bridgework may not be worn into surgery.  .   Patients discharged the day of surgery will not be allowed to drive home.     Colonoscopy, Adult, Care After This sheet gives you information about how to care for yourself after your procedure. Your health care provider may also give you more specific instructions. If you have problems or questions, contact your health care provider. What can I expect after the procedure? After the procedure, it is common to have:  A small amount of blood in your stool for 24 hours after the procedure.  Some gas.  Mild abdominal cramping or bloating.  Follow these instructions at home: General instructions   For the first 24 hours after the procedure: ? Do not drive or use machinery. ? Do not sign important documents. ? Do not drink alcohol. ? Do your regular daily activities at a slower pace than normal. ? Eat soft, easy-to-digest foods. ? Rest often.  Take over-the-counter or prescription medicines only as told by your health care provider.  It is up to you to get the results of your procedure. Ask your health care provider, or the department performing the procedure, when your results will be ready. Relieving cramping and bloating  Try walking around when you have cramps or feel bloated.  Apply heat to your abdomen as told by your health care provider. Use a heat source that your health care  provider recommends, such as a moist heat pack or a heating pad. ? Place a towel between your skin and the heat source. ? Leave the heat on for 20-30 minutes. ? Remove the heat if your skin turns bright red. This is especially important if you are unable to feel pain, heat, or cold. You may have a greater risk of getting burned. Eating and drinking  Drink enough fluid to keep your urine clear or pale yellow.  Resume your normal diet as instructed by your health care provider. Avoid heavy or fried foods that are hard to digest.  Avoid drinking alcohol for as long as instructed by your health care provider. Contact a health care provider if:  You have blood in your stool 2-3 days after the procedure. Get help right away if:  You have more than a small spotting of blood in your stool.  You pass large blood clots in your stool.  Your abdomen is swollen.  You have nausea or vomiting.  You have a fever.  You have increasing abdominal pain that is not relieved with medicine. This information is not intended to replace advice given to you by your health care provider. Make sure you discuss any questions you have with your health care provider. Document Released: 09/14/2003 Document Revised: 10/25/2015 Document Reviewed: 04/13/2015 Elsevier Interactive Patient Education  2018 [employer].

## 2019-08-05 ENCOUNTER — Encounter (HOSPITAL_COMMUNITY)
Admission: RE | Admit: 2019-08-05 | Discharge: 2019-08-05 | Disposition: A | Discharge status: Home / Self Care | Payer: Medicare Other | Source: Ambulatory Visit | Attending: Internal Medicine | Admitting: Internal Medicine

## 2019-08-05 ENCOUNTER — Other Ambulatory Visit: Payer: Self-pay

## 2019-08-05 ENCOUNTER — Other Ambulatory Visit (HOSPITAL_COMMUNITY)
Admission: RE | Admit: 2019-08-05 | Discharge: 2019-08-05 | Disposition: A | Discharge status: Home / Self Care | Payer: Medicare Other | Source: Ambulatory Visit | Attending: Internal Medicine | Admitting: Internal Medicine

## 2019-08-05 ENCOUNTER — Encounter (HOSPITAL_COMMUNITY): Payer: Self-pay

## 2019-08-05 DIAGNOSIS — I10 Essential (primary) hypertension: Secondary | ICD-10-CM | POA: Insufficient documentation

## 2019-08-05 DIAGNOSIS — Z01812 Encounter for preprocedural laboratory examination: Secondary | ICD-10-CM | POA: Diagnosis present

## 2019-08-05 DIAGNOSIS — Z20822 Contact with and (suspected) exposure to covid-19: Secondary | ICD-10-CM | POA: Insufficient documentation

## 2019-08-05 DIAGNOSIS — Z803 Family history of malignant neoplasm of breast: Secondary | ICD-10-CM | POA: Diagnosis not present

## 2019-08-05 DIAGNOSIS — E785 Hyperlipidemia, unspecified: Secondary | ICD-10-CM | POA: Insufficient documentation

## 2019-08-05 LAB — CBC WITH DIFFERENTIAL/PLATELET
Abs Immature Granulocytes: 0.01 10*3/uL (ref 0.00–0.07)
Basophils Absolute: 0 10*3/uL (ref 0.0–0.1)
Basophils Relative: 1 %
Eosinophils Absolute: 0.2 10*3/uL (ref 0.0–0.5)
Eosinophils Relative: 2 %
HCT: 34 % — ABNORMAL LOW (ref 36.0–46.0)
Hemoglobin: 11.2 g/dL — ABNORMAL LOW (ref 12.0–15.0)
Immature Granulocytes: 0 %
Lymphocytes Relative: 34 %
Lymphs Abs: 2.4 10*3/uL (ref 0.7–4.0)
MCH: 30.9 pg (ref 26.0–34.0)
MCHC: 32.9 g/dL (ref 30.0–36.0)
MCV: 93.9 fL (ref 80.0–100.0)
Monocytes Absolute: 0.5 10*3/uL (ref 0.1–1.0)
Monocytes Relative: 7 %
Neutro Abs: 4.1 10*3/uL (ref 1.7–7.7)
Neutrophils Relative %: 56 %
Platelets: 251 10*3/uL (ref 150–400)
RBC: 3.62 MIL/uL — ABNORMAL LOW (ref 3.87–5.11)
RDW: 12.5 % (ref 11.5–15.5)
WBC: 7.2 10*3/uL (ref 4.0–10.5)
nRBC: 0 % (ref 0.0–0.2)

## 2019-08-05 LAB — SARS CORONAVIRUS 2 (TAT 6-24 HRS): SARS Coronavirus 2: NEGATIVE

## 2019-08-07 ENCOUNTER — Ambulatory Visit (HOSPITAL_COMMUNITY)
Admission: RE | Admit: 2019-08-07 | Discharge: 2019-08-07 | Disposition: A | Discharge status: Home / Self Care | Payer: Medicare Other | Attending: Internal Medicine | Admitting: Internal Medicine

## 2019-08-07 ENCOUNTER — Ambulatory Visit (HOSPITAL_COMMUNITY): Payer: Medicare Other | Admitting: Anesthesiology

## 2019-08-07 ENCOUNTER — Encounter (HOSPITAL_COMMUNITY): Payer: Self-pay | Admitting: Internal Medicine

## 2019-08-07 ENCOUNTER — Other Ambulatory Visit: Payer: Self-pay

## 2019-08-07 ENCOUNTER — Encounter (HOSPITAL_COMMUNITY)
Admission: RE | Disposition: A | Discharge status: Home / Self Care | Payer: Self-pay | Source: Home / Self Care | Attending: Internal Medicine

## 2019-08-07 DIAGNOSIS — F329 Major depressive disorder, single episode, unspecified: Secondary | ICD-10-CM | POA: Insufficient documentation

## 2019-08-07 DIAGNOSIS — F419 Anxiety disorder, unspecified: Secondary | ICD-10-CM | POA: Diagnosis not present

## 2019-08-07 DIAGNOSIS — K635 Polyp of colon: Secondary | ICD-10-CM

## 2019-08-07 DIAGNOSIS — K5730 Diverticulosis of large intestine without perforation or abscess without bleeding: Secondary | ICD-10-CM | POA: Diagnosis not present

## 2019-08-07 DIAGNOSIS — Z79899 Other long term (current) drug therapy: Secondary | ICD-10-CM | POA: Insufficient documentation

## 2019-08-07 DIAGNOSIS — Z96653 Presence of artificial knee joint, bilateral: Secondary | ICD-10-CM | POA: Insufficient documentation

## 2019-08-07 DIAGNOSIS — I10 Essential (primary) hypertension: Secondary | ICD-10-CM | POA: Insufficient documentation

## 2019-08-07 DIAGNOSIS — Z853 Personal history of malignant neoplasm of breast: Secondary | ICD-10-CM | POA: Diagnosis not present

## 2019-08-07 DIAGNOSIS — K219 Gastro-esophageal reflux disease without esophagitis: Secondary | ICD-10-CM | POA: Insufficient documentation

## 2019-08-07 DIAGNOSIS — E785 Hyperlipidemia, unspecified: Secondary | ICD-10-CM | POA: Diagnosis not present

## 2019-08-07 DIAGNOSIS — Z1211 Encounter for screening for malignant neoplasm of colon: Secondary | ICD-10-CM | POA: Diagnosis present

## 2019-08-07 HISTORY — PX: POLYPECTOMY: SHX5525

## 2019-08-07 HISTORY — PX: COLONOSCOPY WITH PROPOFOL: SHX5780

## 2019-08-07 SURGERY — COLONOSCOPY WITH PROPOFOL
Anesthesia: General

## 2019-08-07 MED ORDER — CHLORHEXIDINE GLUCONATE CLOTH 2 % EX PADS: 6.0000 | MEDICATED_PAD | Freq: Once | CUTANEOUS | Status: DC

## 2019-08-07 MED ORDER — LIDOCAINE HCL (CARDIAC) PF 100 MG/5ML IV SOSY
PREFILLED_SYRINGE | INTRAVENOUS | Status: DC | PRN
  Administered 2019-08-07: 50 mg via INTRATRACHEAL

## 2019-08-07 MED ORDER — LACTATED RINGERS IV SOLN: INTRAVENOUS | Status: DC | PRN

## 2019-08-07 MED ORDER — PROPOFOL 10 MG/ML IV BOLUS
INTRAVENOUS | Status: DC | PRN
  Administered 2019-08-07: 20 mg via INTRAVENOUS

## 2019-08-07 MED ORDER — GLYCOPYRROLATE 0.2 MG/ML IJ SOLN
INTRAMUSCULAR | Status: DC | PRN
  Administered 2019-08-07: .1 mg via INTRAVENOUS

## 2019-08-07 MED ORDER — PROPOFOL 500 MG/50ML IV EMUL
INTRAVENOUS | Status: DC | PRN
  Administered 2019-08-07: 150 ug/kg/min via INTRAVENOUS

## 2019-08-07 MED ORDER — KETAMINE HCL 50 MG/5ML IJ SOSY
PREFILLED_SYRINGE | INTRAMUSCULAR | Status: AC
  Filled 2019-08-07: qty 5

## 2019-08-07 MED ORDER — KETAMINE HCL 10 MG/ML IJ SOLN
INTRAMUSCULAR | Status: DC | PRN
  Administered 2019-08-07: 30 mg via INTRAVENOUS

## 2019-08-07 MED ORDER — LACTATED RINGERS IV SOLN: Freq: Once | INTRAVENOUS | Status: AC

## 2019-08-07 NOTE — Op Note (Signed)
[facility] Patient Name: Paula Payne Procedure Date: 08/07/2019 12:57 PM MRN: [id number] Date of Birth: [birth year] Attending MD: Norvel Richards , MD CSN: [id number] Age: 69 Admit Type: Outpatient Procedure:                Colonoscopy Indications:              Screening for colorectal malignant neoplasm Providers:                Norvel Richards, MD, Crystal Page, Rosina Lowenstein, RN, Nelma Rothman, Technician Referring MD:              Medicines:                Propofol per Anesthesia Complications:            No immediate complications. Estimated Blood Loss:     Estimated blood loss was minimal. Procedure:                After obtaining informed consent, the colonoscope                            was passed under direct vision. Throughout the                            procedure, the patient's blood pressure, pulse, and                            oxygen saturations were monitored continuously. The                            CF-[id number] ([id number]) scope was introduced through                            the anus and advanced to the the cecum, identified                            by appendiceal orifice and ileocecal valve. Scope In: 1:09:09 PM Scope Out: 1:24:34 PM Scope Withdrawal Time: 0 hours 9 minutes 37 seconds  Total Procedure Duration: 0 hours 15 minutes 25 seconds  Findings:      The perianal and digital rectal examinations were normal.      A 6 mm polyp was found in the splenic flexure. The polyp was       semi-pedunculated. The polyp was removed with a cold snare. Resection       and retrieval were complete. Estimated blood loss was minimal.      Scattered medium-mouthed diverticula were found in the sigmoid colon and       descending colon.      The exam was otherwise without abnormality. Rectal vault seen well       on?"face. Too small to retroflex. Impression:               - One 6 mm polyp at the splenic flexure, removed  with a cold snare. Resected and retrieved.                           - Diverticulosis in the sigmoid colon and in the                            descending colon.                           - The examination was otherwise normal. Moderate Sedation:      Moderate (conscious) sedation was personally administered by an       anesthesia professional. The following parameters were monitored: oxygen       saturation, heart rate, blood pressure, respiratory rate, EKG, adequacy       of pulmonary ventilation, and response to care. Recommendation:           - Patient has a contact number available for                            emergencies. The signs and symptoms of potential                            delayed complications were discussed with the                            patient. Return to normal activities tomorrow.                            Written discharge instructions were provided to the                            patient.                           - Resume previous diet.                           - Continue present medications.                           - Repeat colonoscopy date to be determined after                            pending pathology results are reviewed for                            surveillance.                           - Return to GI office (date not yet determined). Procedure Code(s):        --- Professional ---                           [phone], Colonoscopy, flexible; with removal of  tumor(s), polyp(s), or other lesion(s) by snare                            technique Diagnosis Code(s):        --- Professional ---                           Z12.11, Encounter for screening for malignant                            neoplasm of colon                           K63.5, Polyp of colon                           K57.30, Diverticulosis of large intestine without                            perforation or abscess without bleeding CPT  copyright 2019 [employer]. All rights reserved. The codes documented in this report are preliminary and upon coder review may  be revised to meet current compliance requirements. Cristopher Estimable. Krystan Northrop, MD Norvel Richards, MD 08/07/2019 1:35:15 PM This report has been signed electronically. Number of Addenda: 0

## 2019-08-07 NOTE — Anesthesia Preprocedure Evaluation (Signed)
Anesthesia Evaluation  Patient identified by MRN, date of birth, ID band Patient awake    Reviewed: Allergy & Precautions, NPO status , Patient's Chart, lab work & pertinent test results  History of Anesthesia Complications Negative for: history of anesthetic complications  Airway Mallampati: II  TM Distance: >3 FB Neck ROM: Full    Dental  (+) Caps, Dental Advisory Given   Pulmonary neg pulmonary ROS,    Pulmonary exam normal breath sounds clear to auscultation       Cardiovascular Exercise Tolerance: Good (-) hypertensionNormal cardiovascular exam Rhythm:Regular Rate:Normal     Neuro/Psych PSYCHIATRIC DISORDERS Anxiety Depression negative neurological ROS     GI/Hepatic Neg liver ROS, GERD  Medicated and Controlled,  Endo/Other  negative endocrine ROS  Renal/GU negative Renal ROS  negative genitourinary   Musculoskeletal negative musculoskeletal ROS (+)   Abdominal   Peds  Hematology  (+) anemia ,   Anesthesia Other Findings Breast cancer   Reproductive/Obstetrics negative OB ROS                           Anesthesia Physical Anesthesia Plan  ASA: II  Anesthesia Plan: General   Post-op Pain Management:    Induction: Intravenous  PONV Risk Score and Plan:   Airway Management Planned: Nasal Cannula, Natural Airway and Simple Face Mask  Additional Equipment:   Intra-op Plan:   Post-operative Plan:   Informed Consent: I have reviewed the patients History and Physical, chart, labs and discussed the procedure including the risks, benefits and alternatives for the proposed anesthesia with the patient or authorized representative who has indicated his/her understanding and acceptance.     Dental advisory given  Plan Discussed with: CRNA and Surgeon  Anesthesia Plan Comments:        Anesthesia Quick Evaluation

## 2019-08-07 NOTE — Transfer of Care (Signed)
Immediate Anesthesia Transfer of Care Note  Patient: Paula Payne  Procedure(s) Performed: COLONOSCOPY WITH PROPOFOL (N/A ) POLYPECTOMY  Patient Location: PACU  Anesthesia Type:General  Level of Consciousness: awake, alert  and oriented  Airway & Oxygen Therapy: Patient Spontanous Breathing  Post-op Assessment: Report given to RN and Post -op Vital signs reviewed and stable  Post vital signs: Reviewed and stable  Last Vitals:  Vitals Value Taken Time  BP    Temp    Pulse 66 08/07/19 1331  Resp 18 08/07/19 1331  SpO2 100 % 08/07/19 1331  Vitals shown include unvalidated device data.  Last Pain:  Vitals:   08/07/19 1303  TempSrc:   PainSc: 0-No pain         Complications: No complications documented.

## 2019-08-07 NOTE — H&P (Signed)
@LOGO @   Primary Care Physician:  Tobe Sos, MD Primary Gastroenterologist:  Dr. Gala Romney  Pre-Procedure History & Physical: HPI:  Paula Payne is a 69 y.o. female is here for a screening colonoscopy.  Last colonoscopy least 10 years ago.  Reportedly normal.  Details unknown.  Currently, no GI symptoms.  No family history of colon cancer.   Past Medical History:  Diagnosis Date  . Anxiety and depression   . Breast cancer ([place]) 2018  . GERD (gastroesophageal reflux disease)   . Hyperlipidemia   . Hypertension   . Insomnia     Past Surgical History:  Procedure Laterality Date  . BREAST LUMPECTOMY Right 2018  . Complete hysterectomy    . REPLACEMENT TOTAL KNEE BILATERAL    . ROTATOR CUFF REPAIR Left   . TONSILLECTOMY      Prior to Admission medications   Medication Sig Start Date End Date Taking? Authorizing Provider  alum hydroxide-mag trisilicate (GAVISCON) 36-62 MG CHEW chewable tablet Chew 1 tablet by mouth 3 (three) times daily as needed for indigestion or heartburn.   Yes [provider]  Ascorbic Acid (VITAMIN C) 1000 MG tablet Take 1,000 mg by mouth daily.   Yes [provider]  calcium carbonate (CALCIUM 600) 600 MG TABS tablet Take 600 mg by mouth daily.    Yes [provider]  cholecalciferol (VITAMIN D3) 25 MCG (1000 UNIT) tablet Take 1,000 Units by mouth daily.   Yes [provider]  clonazePAM (KLONOPIN) 0.5 MG tablet Take 0.5 mg by mouth daily as needed for anxiety.    Yes [provider]  Cyanocobalamin (B-12 PO) Take 1 tablet by mouth daily.    Yes [provider]  fluticasone (FLONASE) 50 MCG/ACT nasal spray Place 1 spray into both nostrils daily.    Yes [provider]  ibuprofen (ADVIL) 800 MG tablet Take 800 mg by mouth every 8 (eight) hours as needed for moderate pain.   Yes [provider]  letrozole (FEMARA) 2.5 MG tablet Take 2.5 mg by mouth daily. 03/28/19  Yes [provider]  montelukast (SINGULAIR) 10 MG tablet Take 10 mg by mouth daily as needed (allergies).   Yes [provider]  nitrofurantoin (MACRODANTIN) 100 MG capsule Take 100 mg by mouth 3 (three) times a week.   Yes [provider]  omeprazole (PRILOSEC) 40 MG capsule Take 40 mg by mouth daily.   Yes [provider]  QUEtiapine (SEROQUEL) 25 MG tablet Take 25 mg by mouth at bedtime.   Yes [provider]  simvastatin (ZOCOR) 40 MG tablet Take 40 mg by mouth daily.   Yes [provider]  citalopram (CELEXA) 20 MG tablet Take 20 mg by mouth daily as needed (anxiety).  05/10/19   [provider]  diphenhydrAMINE (BENADRYL) 25 mg capsule Take 25 mg by mouth every 6 (six) hours as needed for allergies.    [provider]    Allergies as of 05/26/2019 - Review Complete 05/26/2019  Allergen Reaction Noted  . Amoxicillin Other (See Comments) 05/26/2019  . Codeine  05/26/2019  . Celecoxib Rash 05/26/2019  . Other Rash 05/26/2019    Family History  Problem Relation Age of Onset  . Colon cancer Neg Hx     Social History   Socioeconomic History  . Marital status: Married    Spouse name: Not on file  . Number of children: Not on file  . Years of education: Not on file  .  Highest education level: Not on file  Occupational History  . Not on file  Tobacco Use  . Smoking status: Never Smoker  . Smokeless tobacco: Never Used  Substance and Sexual Activity  . Alcohol use: Never  . Drug use: Never  . Sexual activity: Not on file  Other Topics Concern  . Not on file  Social History Narrative  . Not on file   Social Determinants of Health   Financial Resource Strain:   . Difficulty of Paying Living Expenses:   Food Insecurity:   . Worried About Charity fundraiser in the Last Year:   . Arboriculturist in the Last Year:   Transportation Needs:   . Film/video editor (Medical):   Marland Kitchen Lack of Transportation  (Non-Medical):   Physical Activity:   . Days of Exercise per Week:   . Minutes of Exercise per Session:   Stress:   . Feeling of Stress :   Social Connections:   . Frequency of Communication with Friends and Family:   . Frequency of Social Gatherings with Friends and Family:   . Attends Religious Services:   . Active Member of Clubs or Organizations:   . Attends Archivist Meetings:   Marland Kitchen Marital Status:   Intimate Partner Violence:   . Fear of Current or Ex-Partner:   . Emotionally Abused:   Marland Kitchen Physically Abused:   . Sexually Abused:     Review of Systems: See HPI, otherwise negative ROS  Physical Exam: BP (!) 144/58   Pulse 60   Temp 98.1 F (36.7 C) (Oral)   Resp 16   Ht 5\' 7"  (1.702 m)   Wt 74.8 kg   SpO2 99%   BMI 25.83 kg/m  General:   Alert,  Well-developed, well-nourished, pleasant and cooperative in NAD zes, crackles, or rhonchi. No acute distress. Heart:  Regular rate and rhythm; no murmurs, clicks, rubs,  or gallops. Abdomen:  Soft, nontender and nondistended. No masses, hepatosplenomegaly or hernias noted. Normal bowel sounds, without guarding, and without rebound.    Impression/Plan: Paula Payne is now here to undergo a screening colonoscopy.  Average risk screening examination.  Risks, benefits, limitations, imponderables and alternatives regarding colonoscopy have been reviewed with the patient. Questions have been answered. All parties agreeable.     Notice:  This dictation was prepared with Dragon dictation along with smaller phrase technology. Any transcriptional errors that result from this process are unintentional and may not be corrected upon review.

## 2019-08-07 NOTE — Discharge Instructions (Signed)
Diverticulosis  Diverticulosis is a condition that develops when small pouches (diverticula) form in the wall of the large intestine (colon). The colon is where water is absorbed and stool (feces) is formed. The pouches form when the inside layer of the colon pushes through weak spots in the outer layers of the colon. You may have a few pouches or many of them. The pouches usually do not cause problems unless they become inflamed or infected. When this happens, the condition is called diverticulitis. What are the causes? The cause of this condition is not known. What increases the risk? The following factors may make you more likely to develop this condition:  Being older than age 18. Your risk for this condition increases with age. Diverticulosis is rare among people younger than age 47. By age 73, many people have it.  Eating a low-fiber diet.  Having frequent constipation.  Being overweight.  Not getting enough exercise.  Smoking.  Taking over-the-counter pain medicines, like aspirin and ibuprofen.  Having a family history of diverticulosis. What are the signs or symptoms? In most people, there are no symptoms of this condition. If you do have symptoms, they may include:  Bloating.  Cramps in the abdomen.  Constipation or diarrhea.  Pain in the lower left side of the abdomen. How is this diagnosed? Because diverticulosis usually has no symptoms, it is most often diagnosed during an exam for other colon problems. The condition may be diagnosed by:  Using a flexible scope to examine the colon (colonoscopy).  Taking an X-ray of the colon after dye has been put into the colon (barium enema).  Having a CT scan. How is this treated? You may not need treatment for this condition. Your health care provider may recommend treatment to prevent problems. You may need treatment if you have symptoms or if you previously had diverticulitis. Treatment may include:  Eating a high-fiber  diet.  Taking a fiber supplement.  Taking a live bacteria supplement (probiotic).  Taking medicine to relax your colon. Follow these instructions at home: Medicines  Take over-the-counter and prescription medicines only as told by your health care provider.  If told by your health care provider, take a fiber supplement or probiotic. Constipation prevention Your condition may cause constipation. To prevent or treat constipation, you may need to:  Drink enough fluid to keep your urine pale yellow.  Take over-the-counter or prescription medicines.  Eat foods that are high in fiber, such as beans, whole grains, and fresh fruits and vegetables.  Limit foods that are high in fat and processed sugars, such as fried or sweet foods.  General instructions  Try not to strain when you have a bowel movement.  Keep all follow-up visits as told by your health care provider. This is important. Contact a health care provider if you:  Have pain in your abdomen.  Have bloating.  Have cramps.  Have not had a bowel movement in 3 days. Get help right away if:  Your pain gets worse.  Your bloating becomes very bad.  You have a fever or chills, and your symptoms suddenly get worse.  You vomit.  You have bowel movements that are bloody or black.  You have bleeding from your rectum. Summary  Diverticulosis is a condition that develops when small pouches (diverticula) form in the wall of the large intestine (colon).  You may have a few pouches or many of them.  This condition is most often diagnosed during an exam for other colon  problems.  Treatment may include increasing the fiber in your diet, taking supplements, or taking medicines. This information is not intended to replace advice given to you by your health care provider. Make sure you discuss any questions you have with your health care provider. Document Revised: 08/29/2018 Document Reviewed: 08/29/2018 Elsevier Patient  Education  [place] After These instructions provide you with information about caring for yourself after your procedure. Your health care provider may also give you more specific instructions. Your treatment has been planned according to current medical practices, but problems sometimes occur. Call your health care provider if you have any problems or questions after your procedure. What can I expect after the procedure? After your procedure, you may:  Feel sleepy for several hours.  Feel clumsy and have poor balance for several hours.  Feel forgetful about what happened after the procedure.  Have poor judgment for several hours.  Feel nauseous or vomit.  Have a sore throat if you had a breathing tube during the procedure. Follow these instructions at home: For at least 24 hours after the procedure:      Have a responsible adult stay with you. It is important to have someone help care for you until you are awake and alert.  Rest as needed.  Do not: ? Participate in activities in which you could fall or become injured. ? Drive. ? Use heavy machinery. ? Drink alcohol. ? Take sleeping pills or medicines that cause drowsiness. ? Make important decisions or sign legal documents. ? Take care of children on your own. Eating and drinking  Follow the diet that is recommended by your health care provider.  If you vomit, drink water, juice, or soup when you can drink without vomiting.  Make sure you have little or no nausea before eating solid foods. General instructions  Take over-the-counter and prescription medicines only as told by your health care provider.  If you have sleep apnea, surgery and certain medicines can increase your risk for breathing problems. Follow instructions from your health care provider about wearing your sleep device: ? Anytime you are sleeping, including during daytime naps. ? While taking prescription  pain medicines, sleeping medicines, or medicines that make you drowsy.  If you smoke, do not smoke without supervision.  Keep all follow-up visits as told by your health care provider. This is important. Contact a health care provider if:  You keep feeling nauseous or you keep vomiting.  You feel light-headed.  You develop a rash.  You have a fever. Get help right away if:  You have trouble breathing. Summary  For several hours after your procedure, you may feel sleepy and have poor judgment.  Have a responsible adult stay with you for at least 24 hours or until you are awake and alert. This information is not intended to replace advice given to you by your health care provider. Make sure you discuss any questions you have with your health care provider. Document Revised: 04/30/2017 Document Reviewed: 05/23/2015 Elsevier Patient Education  [place]. Colon Polyps  Polyps are tissue growths inside the body. Polyps can grow in many places, including the large intestine (colon). A polyp may be a round bump or a mushroom-shaped growth. You could have one polyp or several. Most colon polyps are noncancerous (benign). However, some colon polyps can become cancerous over time. Finding and removing the polyps early can help prevent this. What are the causes? The exact cause of  colon polyps is not known. What increases the risk? You are more likely to develop this condition if you:  Have a family history of colon cancer or colon polyps.  Are older than 42 or older than 45 if you are African American.  Have inflammatory bowel disease, such as ulcerative colitis or Crohn's disease.  Have certain hereditary conditions, such as: ? Familial adenomatous polyposis. ? Lynch syndrome. ? Turcot syndrome. ? Peutz-Jeghers syndrome.  Are overweight.  Smoke cigarettes.  Do not get enough exercise.  Drink too much alcohol.  Eat a diet that is high in fat and red meat and low in  fiber.  Had childhood cancer that was treated with abdominal radiation. What are the signs or symptoms? Most polyps do not cause symptoms. If you have symptoms, they may include:  Blood coming from your rectum when having a bowel movement.  Blood in your stool. The stool may look dark red or black.  Abdominal pain.  A change in bowel habits, such as constipation or diarrhea. How is this diagnosed? This condition is diagnosed with a colonoscopy. This is a procedure in which a lighted, flexible scope is inserted into the anus and then passed into the colon to examine the area. Polyps are sometimes found when a colonoscopy is done as part of routine cancer screening tests. How is this treated? Treatment for this condition involves removing any polyps that are found. Most polyps can be removed during a colonoscopy. Those polyps will then be tested for cancer. Additional treatment may be needed depending on the results of testing. Follow these instructions at home: Lifestyle  Maintain a healthy weight, or lose weight if recommended by your health care provider.  Exercise every day or as told by your health care provider.  Do not use any products that contain nicotine or tobacco, such as cigarettes and e-cigarettes. If you need help quitting, ask your health care provider.  If you drink alcohol, limit how much you have: ? 0-1 drink a day for women. ? 0-2 drinks a day for men.  Be aware of how much alcohol is in your drink. In the [place] one drink equals one 12 oz bottle of beer (355 mL), one 5 oz glass of wine (148 mL), or one 1 oz shot of hard liquor (44 mL). Eating and drinking   Eat foods that are high in fiber, such as fruits, vegetables, and whole grains.  Eat foods that are high in calcium and vitamin D, such as milk, cheese, yogurt, eggs, liver, fish, and broccoli.  Limit foods that are high in fat, such as fried foods and desserts.  Limit the amount of red meat and  processed meat you eat, such as hot dogs, sausage, bacon, and lunch meats. General instructions  Keep all follow-up visits as told by your health care provider. This is important. ? This includes having regularly scheduled colonoscopies. ? Talk to your health care provider about when you need a colonoscopy. Contact a health care provider if:  You have new or worsening bleeding during a bowel movement.  You have new or increased blood in your stool.  You have a change in bowel habits.  You lose weight for no known reason. Summary  Polyps are tissue growths inside the body. Polyps can grow in many places, including the colon.  Most colon polyps are noncancerous (benign), but some can become cancerous over time.  This condition is diagnosed with a colonoscopy.  Treatment for this condition involves removing  any polyps that are found. Most polyps can be removed during a colonoscopy. This information is not intended to replace advice given to you by your health care provider. Make sure you discuss any questions you have with your health care provider. Document Revised: 05/17/2017 Document Reviewed: 05/17/2017 Elsevier Patient Education  [place].   Colonoscopy Discharge Instructions  Read the instructions outlined below and refer to this sheet in the next few weeks. These discharge instructions provide you with general information on caring for yourself after you leave the hospital. Your doctor may also give you specific instructions. While your treatment has been planned according to the most current medical practices available, unavoidable complications occasionally occur. If you have any problems or questions after discharge, call Dr. Gala Romney at [phone]. ACTIVITY  You may resume your regular activity, but move at a slower pace for the next 24 hours.   Take frequent rest periods for the next 24 hours.   Walking will help get rid of the air and reduce the bloated feeling  in your belly (abdomen).   No driving for 24 hours (because of the medicine (anesthesia) used during the test).    Do not sign any important legal documents or operate any machinery for 24 hours (because of the anesthesia used during the test).  NUTRITION  Drink plenty of fluids.   You may resume your normal diet as instructed by your doctor.   Begin with a light meal and progress to your normal diet. Heavy or fried foods are harder to digest and may make you feel sick to your stomach (nauseated).   Avoid alcoholic beverages for 24 hours or as instructed.  MEDICATIONS  You may resume your normal medications unless your doctor tells you otherwise.  WHAT YOU CAN EXPECT TODAY  Some feelings of bloating in the abdomen.   Passage of more gas than usual.   Spotting of blood in your stool or on the toilet paper.  IF YOU HAD POLYPS REMOVED DURING THE COLONOSCOPY:  No aspirin products for 7 days or as instructed.   No alcohol for 7 days or as instructed.   Eat a soft diet for the next 24 hours.  FINDING OUT THE RESULTS OF YOUR TEST Not all test results are available during your visit. If your test results are not back during the visit, make an appointment with your caregiver to find out the results. Do not assume everything is normal if you have not heard from your caregiver or the medical facility. It is important for you to follow up on all of your test results.  SEEK IMMEDIATE MEDICAL ATTENTION IF:  You have more than a spotting of blood in your stool.   Your belly is swollen (abdominal distention).   You are nauseated or vomiting.   You have a temperature over 101.   You have abdominal pain or discomfort that is severe or gets worse throughout the day.    Colon polyp and diverticulosis information provided  One polyp removed from your colon today  Further recommendations to follow pending review of pathology report  At patient request, I called Kennyth Lose at [phone]  and reviewed results

## 2019-08-07 NOTE — Anesthesia Postprocedure Evaluation (Signed)
Anesthesia Post Note  Patient: Paula Payne  Procedure(s) Performed: COLONOSCOPY WITH PROPOFOL (N/A ) POLYPECTOMY  Patient location during evaluation: PACU Anesthesia Type: General Level of consciousness: awake and alert Pain management: pain level controlled Vital Signs Assessment: post-procedure vital signs reviewed and stable Respiratory status: spontaneous breathing Cardiovascular status: stable Postop Assessment: no apparent nausea or vomiting Anesthetic complications: no   No complications documented.   Last Vitals:  Vitals:   08/07/19 1141  BP: (!) 144/58  Pulse: 60  Resp: 16  Temp: 36.7 C  SpO2: 99%    Last Pain:  Vitals:   08/07/19 1303  TempSrc:   PainSc: 0-No pain                 Kalaya Infantino Hristova

## 2019-08-08 ENCOUNTER — Encounter: Payer: Self-pay | Admitting: Internal Medicine

## 2019-08-08 LAB — SURGICAL PATHOLOGY

## 2019-08-11 NOTE — Addendum Note (Signed)
Addendum  created 08/11/19 1607 by Vista Deck, CRNA   Charge Capture section accepted

## 2019-08-12 ENCOUNTER — Encounter (HOSPITAL_COMMUNITY): Payer: Self-pay | Admitting: Internal Medicine
